# Patient Record
Sex: Male | Born: 1980 | Race: Black or African American | Hispanic: No | Marital: Single | Smoking: Never smoker
Health system: Southern US, Community
[De-identification: ages and names within clinical notes are randomized; demographics above are authoritative.]

## PROBLEM LIST (undated history)

## (undated) HISTORY — PX: ARTHROSCOPIC REPAIR ACL: SUR80

---

## 2004-02-26 ENCOUNTER — Emergency Department (HOSPITAL_COMMUNITY): Admission: EM | Admit: 2004-02-26 | Discharge: 2004-02-27 | Payer: Self-pay | Admitting: Emergency Medicine

## 2004-03-13 ENCOUNTER — Ambulatory Visit (HOSPITAL_COMMUNITY): Admission: RE | Admit: 2004-03-13 | Discharge: 2004-03-13 | Payer: Self-pay | Admitting: Orthopaedic Surgery

## 2004-04-01 ENCOUNTER — Ambulatory Visit (HOSPITAL_COMMUNITY): Admission: RE | Admit: 2004-04-01 | Discharge: 2004-04-02 | Payer: Self-pay | Admitting: Orthopaedic Surgery

## 2004-04-16 ENCOUNTER — Encounter: Admission: RE | Admit: 2004-04-16 | Discharge: 2004-05-19 | Payer: Self-pay | Admitting: Orthopaedic Surgery

## 2004-06-03 ENCOUNTER — Ambulatory Visit (HOSPITAL_COMMUNITY): Admission: RE | Admit: 2004-06-03 | Discharge: 2004-06-04 | Payer: Self-pay | Admitting: Orthopaedic Surgery

## 2004-06-06 ENCOUNTER — Encounter: Admission: RE | Admit: 2004-06-06 | Discharge: 2004-09-04 | Payer: Self-pay | Admitting: Orthopaedic Surgery

## 2005-05-05 ENCOUNTER — Emergency Department (HOSPITAL_COMMUNITY): Admission: EM | Admit: 2005-05-05 | Discharge: 2005-05-06 | Payer: Self-pay | Admitting: Emergency Medicine

## 2005-05-12 ENCOUNTER — Emergency Department (HOSPITAL_COMMUNITY): Admission: EM | Admit: 2005-05-12 | Discharge: 2005-05-13 | Payer: Self-pay | Admitting: Emergency Medicine

## 2006-03-23 ENCOUNTER — Emergency Department (HOSPITAL_COMMUNITY): Admission: EM | Admit: 2006-03-23 | Discharge: 2006-03-24 | Payer: Self-pay | Admitting: Emergency Medicine

## 2006-03-31 ENCOUNTER — Emergency Department (HOSPITAL_COMMUNITY): Admission: EM | Admit: 2006-03-31 | Discharge: 2006-03-31 | Payer: Self-pay | Admitting: Emergency Medicine

## 2006-06-18 ENCOUNTER — Emergency Department (HOSPITAL_COMMUNITY): Admission: EM | Admit: 2006-06-18 | Discharge: 2006-06-18 | Payer: Self-pay | Admitting: Emergency Medicine

## 2007-03-19 IMAGING — CR DG KNEE COMPLETE 4+V*L*
4 series · 4 of 4 positions shown · non-contrast
Comparison: none

CLINICAL DATA: Knee pain and swelling after a fall.
 LEFT KNEE ? 3 VIEW ? 06/18/06:

[t knee ap left]
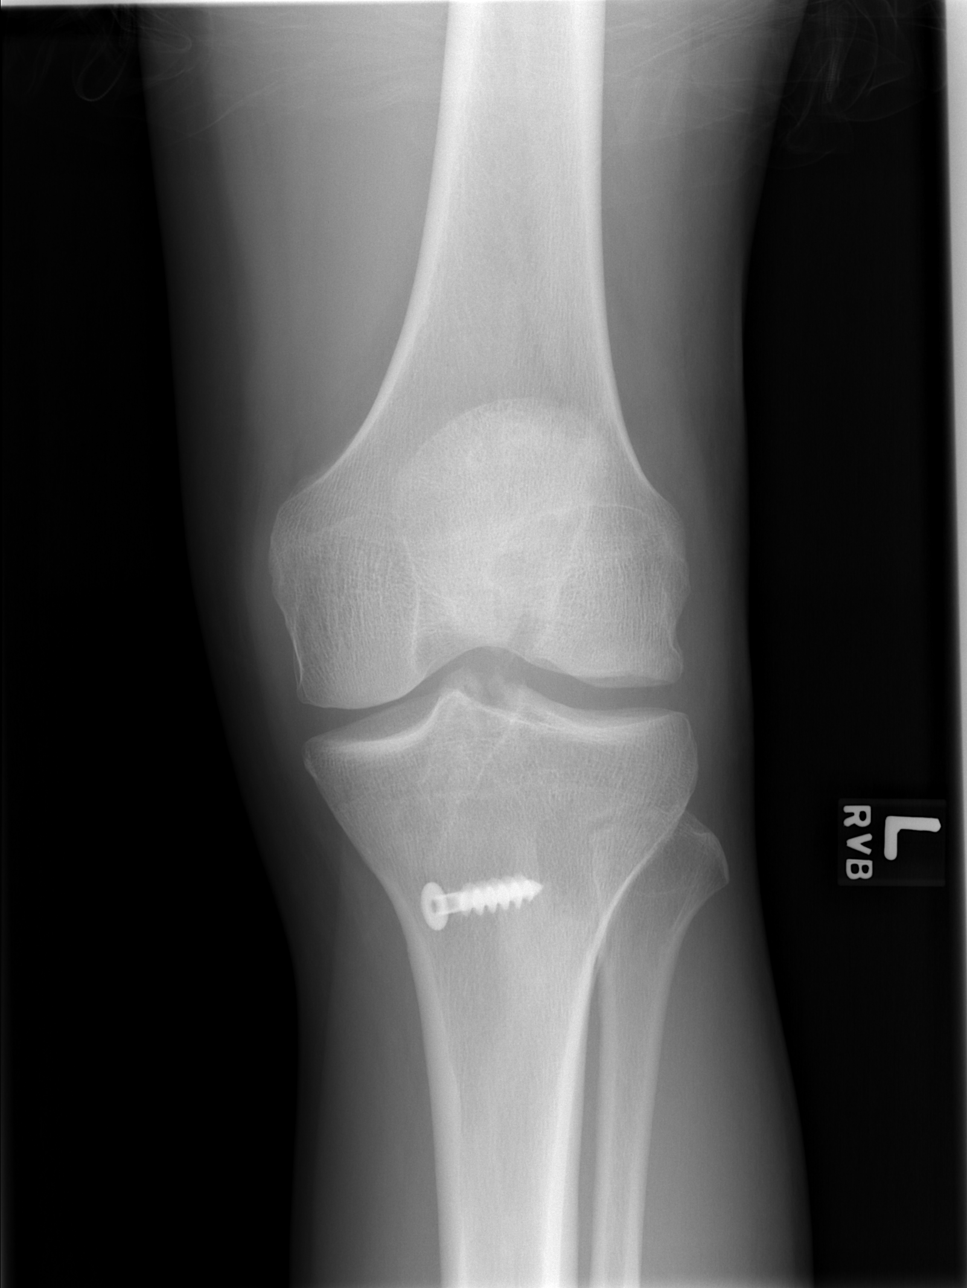

[t knee oblique left (1 of 2)]
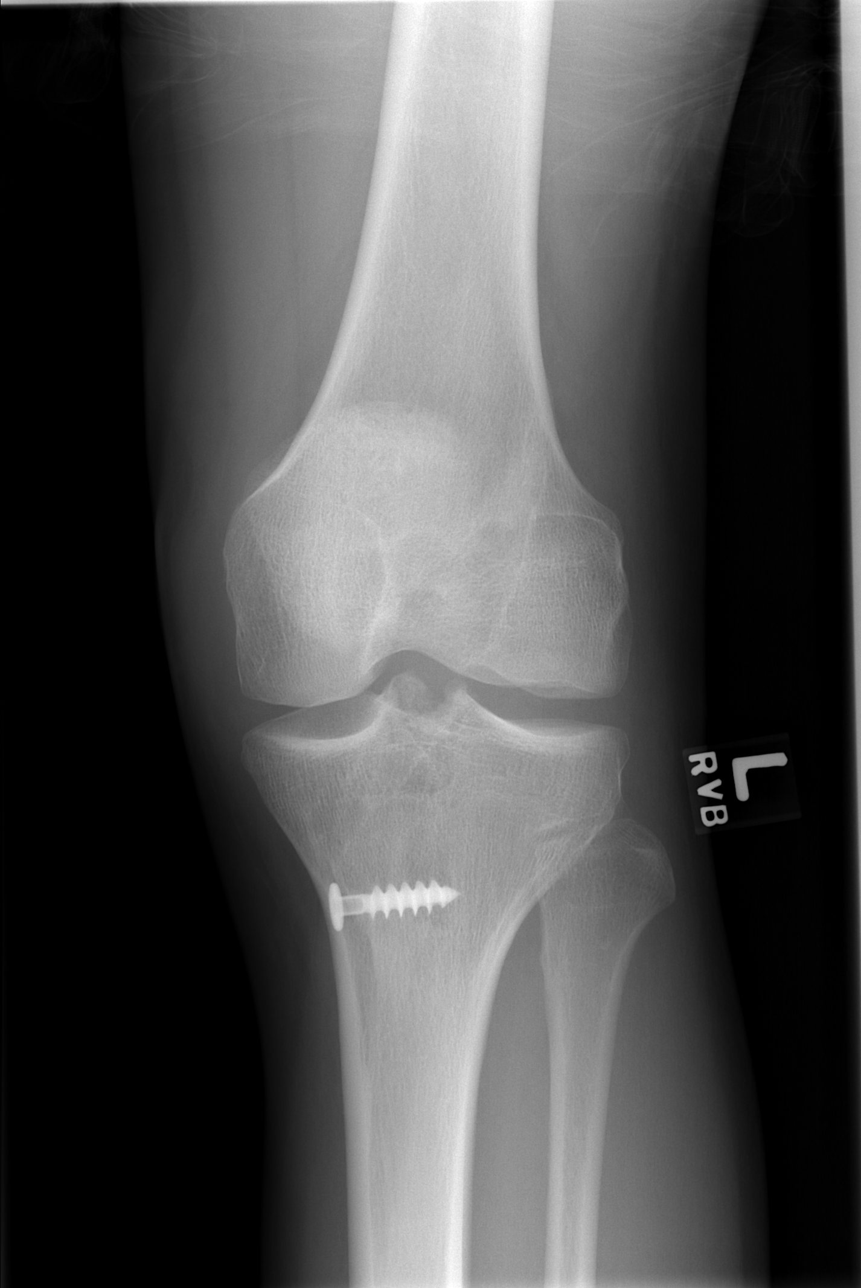

[t knee oblique left (2 of 2)]
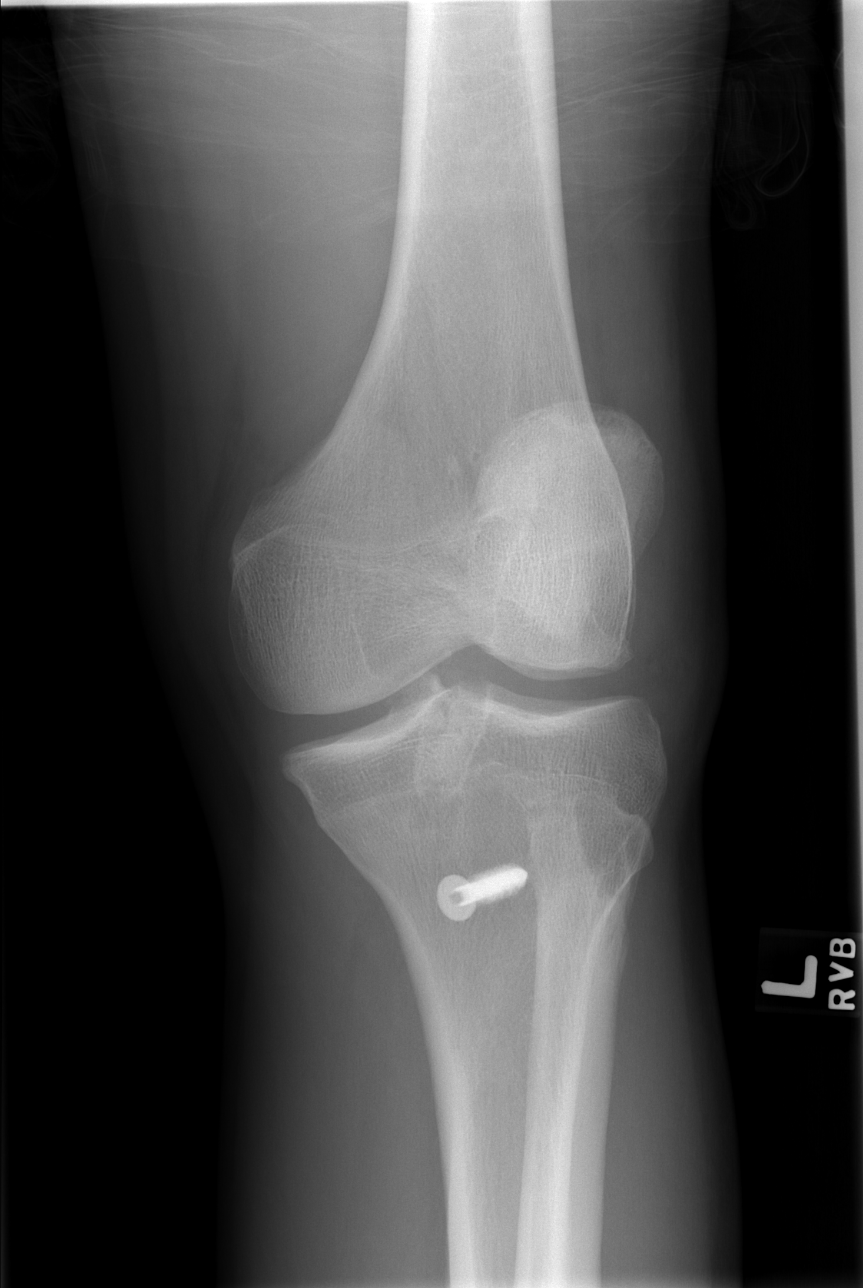

[t knee lat left]
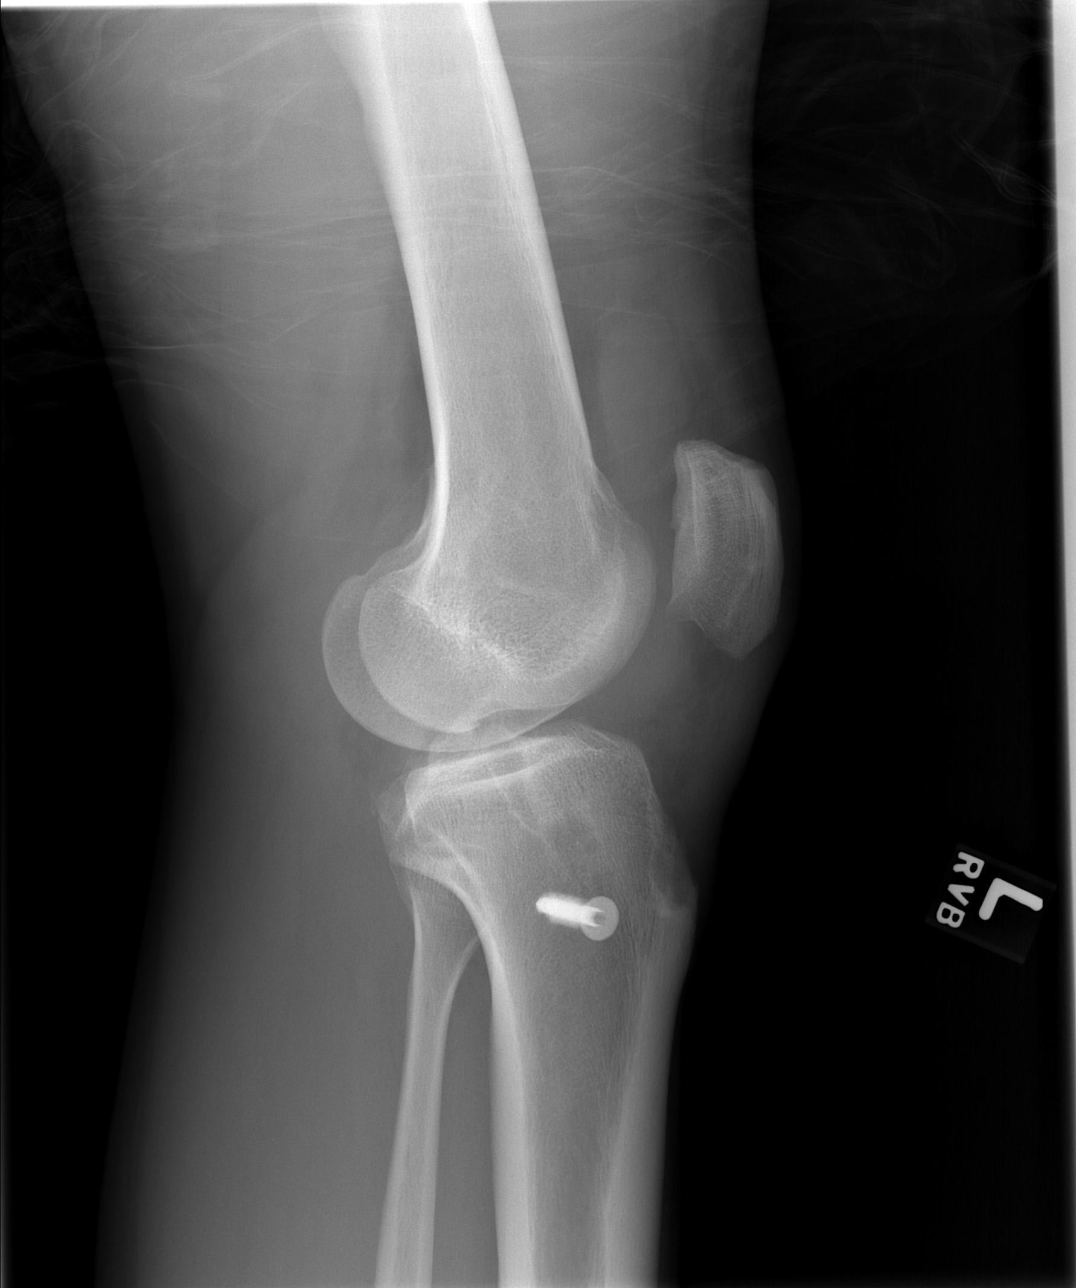

[4 of 4 positions shown; findings below may reference images not displayed]

FINDINGS: There are postoperative changes from ACL repair.  There is joint effusion identified.  The quadriceps and patellar tendons are intact.  There is moderate degenerative joint disease.  No fracture is noted.
IMPRESSION: 1.  Moderate size suprapatellar joint effusion.
 2.  Postoperative changes from ACL repair.
 3.  Degenerative joint disease.

## 2008-02-23 ENCOUNTER — Emergency Department (HOSPITAL_COMMUNITY): Admission: EM | Admit: 2008-02-23 | Discharge: 2008-02-23 | Payer: Self-pay | Admitting: Emergency Medicine

## 2010-12-26 NOTE — Op Note (Signed)
NAME:  Allen Leon, Allen Leon                         ACCOUNT NO.:  [id number]   MEDICAL RECORD NO.:  [id number]                   PATIENT TYPE:  OIB   LOCATION:  5013                                 FACILITY:  [facility]   PHYSICIAN:  Vanita Panda. Magnus Ivan, M.D.       DATE OF BIRTH:  07-Feb-1981   DATE OF PROCEDURE:  04/02/2004  DATE OF DISCHARGE:                                 OPERATIVE REPORT   PREOPERATIVE DIAGNOSIS:  Left knee anterior cruciate ligament rupture  (acute).   PROCEDURES:  1. Left knee anterior cruciate ligament reconstruction with bone-tendon-bone     autograft.  2. Partial left lateral meniscectomy.   SURGEON:  Vanita Panda. Magnus Ivan, M.D.   FIRST ASSISTANT:  Burnard Bunting, M.D.   ANESTHESIA:  General.   ESTIMATED BLOOD LOSS:  50 mL.   FLUIDS REPLACED:  2200 mL.   ANTIBIOTICS:  Ancef 1 g IV.   TOURNIQUET TIME:  2 hours 9 minutes.   INDICATIONS:  Briefly, Allen Leon is a 30 year old who was about six weeks ago  playing basketball and felt a pop in his knee.  He had symptoms of  instability and a large hemarthrosis.  He was seen in the orthopedic surgery  clinic and it was felt that he had an acute ACL rupture.  An MRI was  obtained of his left knee, and this did verify an acute rupture of the ACL.  It was recommended that he undergo ACL  reconstruction.  The risks and  benefits of this were explained to him, including infection, anesthesia,  nerve or vessel injury, and blood loss, and he agreed to proceed with  surgery.  He was also presented with options including bone-tendon-bone  autograft as well as Achilles tendon allograft.  He elected to undergo bone-  tendon-bone autograft.   PROCEDURE DESCRIPTION:  After being brought to the operating room, he was  placed supine on the operating table and general anesthesia was obtained.  A  nonsterile tourniquet was then placed around his left eye and then sterile  drapes were placed.  His knee was shaved and his leg  was prepped and draped  with Duraprep from the thigh down to the toes, and again sterile dressings  were applied.  A bump was placed under his left hip prior to draping.  First  attention was made in examining the knee.  Again, prior to prepping and  draping, the knee was examined and found to have a 1+ positive Lachman with  a negative pivot shift test.  This was definitely loose compared to the  contralateral side.  Next, after the knee was prepped and draped attention  was turned to harvesting the bone-tendon-bone autograft.  A longitudinal  incision was made with the knee flexed 90 degrees.  A longitudinal incision  was made from just proximal to the inferior pole of the patella down to the  level of the tibial tubercle.  This was carried sharply  through the skin and  subcutaneous tissue.  The peritenon was identified and divided  longitudinally to allow exposure of the patellar tendon.  Next a 10 mm  harvest blade was utilized to obtain the bone-tendon-bone autograft.  The  midsection of the patellar tendon was chosen and a longitudinal incision was  made with a 10 mm blade from a superior to an inferior direction.  Next a  Micro-Choice saw was used to obtain bone plugs from both the proximal tibia  and the inferior pole of the patella.  The graft was then harvested and  secured to the table.  Both bone plugs were shaped with the rongeur to allow  for passage through a 10 mm tunnel.  Drill holes were made in the superior  plug and inferior plug to allow for passage of the graft.  The wound was  then irrigated and the patellar tendon was reapproximated with interrupted 0  Vicryl sutures.  Next the arthroscopy portion of the case was started.  Through the original longitudinal incision an anterior lateral portal was  made just lateral to the patellar tendon.  The arthroscope was inserted and  the diagnostic portion of the arthroscopy was performed.  The intercondylar  area was assessed,  and there was noted an acute rupture of the ACL tendon  with an empty wall sign as well.  The medial and lateral compartments were  assessed.  The medial compartment was probed and found to have no evidence  of meniscal tear or cartilage injury.  The lateral compartment was probed,  and there did show some roughening of the surface of the lateral femoral  condyle with a grade 1 chondromalacia.  There was also noted a very small  radial tear in the lateral meniscus.  A side biter was used to cut this out  and then partial meniscal shaving was performed.  After this was performed,  the intercondylar area was performed.  The shaver was used to identify the  footprint of the ACL and then to clean out the lateral wall of the femur.  A  high-speed bur was likewise used to repair the intercondylar notch area.  This was all accomplished through an anteromedial portal through  arthroscopy.  Next an ACL guide was inserted into the medial portal to allow  for drilling of the tibial tunnel.  A guide pin was inserted approximately  1.5 cm distal to the tibial plateau and medial to the tibial tubercle.  The  guide pin was inserted and found to have an adequate position through the  footprint.  Next the tunnel was overdrilled with a 9 mm drill.  Next the  guide pin was inserted into the femoral portion and a footprint was made in  this area.  This was then likewise drilled to the level of 25 mm.  The guide  pin was inserted to allow for the passage of the #5 Ethibond suture.  This  was from the femoral bone plug of the graft.  The graft was then passed in a  retrograde manner from the tibial tunnel up into the intercondylar area and  then finally with the bone plug into the femoral tunnel.  The placement was  verified arthroscopically.  The femoral bone plug was then secured with an 8  x 23 mm Bio Interference screw from [employer].  After this was secured, a 6.5 x 30 mm cancellous screw post was placed in  the tibial plateau.  A #2  Fibrewire that was  placed in the tibial bone plug was inserted around the  post, and the post was tightened down.  The knee was put through a range of  motion with the graft in place and showed him to have no impingement with  full extension to 90 degrees of flexion.  The joint itself was then  copiously irrigated with several liters of normal saline solution.  The  remainder of the peritenon was closed with interrupted 0 suture.  The  arthroscopic portals were closed with interrupted 2-0 Vicryl suture,  followed by the subcutaneous tissue with 2-0 Vicryl suture in an interrupted  format.  Prolene 4-0 suture was then used as a continuous stitch through the  incision.  Steri-Strips were applied.  After this sterile dressings and knee  immobilizer were applied as well.  The tourniquet was let down with a total  tourniquet time of 2 hours 9 minutes.  The toes did pinken nicely.  He was  placed in a knee immobilizer, awakened, extubated, and taken to the recovery  room in stable condition.                                               Vanita Panda. Magnus Ivan, M.D.    CYB/MEDQ  D:  04/02/2004  T:  04/02/2004  Job:  [id number]

## 2010-12-26 NOTE — Op Note (Signed)
Allen Leon, Allen Leon               ACCOUNT NO.:  [id number]   MEDICAL RECORD NO.:  [id number]          PATIENT TYPE:  OIB   LOCATION:  2871                         FACILITY:  [facility]   PHYSICIAN:  Vanita Panda. 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   PROCEDURE:  1.  Left knee manipulation under anesthesia.  2.  Left knee arthroscopy with debridement and lysis of adhesions.   SURGEON:  Vanita Panda. Magnus Ivan, M.D.   ANESTHESIA:  General.   ESTIMATED BLOOD LOSS:  Minimal.   FLUIDS REPLACED:  1000 mL.   COMPLICATIONS:  None.   FINDINGS:  Preoperative range of motion 0 degrees to 30 degrees,  postoperative range of motion 0 degrees to 100 degrees.   INDICATIONS FOR PROCEDURE:  Briefly, Ronnald Nian is a 30 year old who,  approximately eight weeks ago, underwent a left ACL reconstruction using  bone-tendon-bone autograft.  Postoperatively, he had difficulty with therapy  secondary to pain in his knee as well as his lack of health insurance.  I  tried to work on getting his knee moving but after six weeks, his knee  motion had plateau'd.  He was able to get to therapy at that standpoint,  however, he certainly showed considerable lack of range of motion.  It was  recommended that he undergo manipulation under anesthesia with arthroscopy  to assess the ACL and he agreed to proceed with this.   DESCRIPTION OF PROCEDURE:  After being brought to the operating room, he was  placed supine on the operating table, and general anesthesia was obtained.  Manipulation of his knee was performed.  Before the manipulation, range of  motion estimated to be 0 to approximately 35  degrees.  Then, under gentle  manipulation, I was able to flex the knee down to at least 110 to 120  degrees.  I could feel adhesions giving way and there was even audible  giving way of adhesions during the manipulation.  His knee felt stable after  this and, on clinical exam, the ACL reconstruction felt stable, as well.  Next, the knee was prepped and draped with DuraPrep and sterile drapes for  the arthroscopy portion of the case.  The knee was flexed off the table and  anterior inferior lateral portal was made sharply with the knife and the  arthroscope was inserted.  A diagnostic tour of the knee was undertaken and  there was noted multiple scarring of the tissue.  The ACL was assessed and  pictures were obtained and showed an intact ACL.  An anterior inferior  medial portal was then made and the probe was inserted and showed integrity  of the ACL.  The knee was stressed, also, under arthroscopy and shown to  have a stable graft.  Next, a shaver was inserted and lysis of adhesions was  performed throughout  all three compartments of the knee.  Following the  arthroscopy, the scope was removed and injection of 0.25% Marcaine,  epinephrine, and 4 mg morphine was inserted into the knee, itself.  The  portals were closed with interrupted 4-0 nylon sutures and sterile dressings  were applied.  Postoperatively, he will be admitted to the hospital and set  up immediately in a CPM from 0 to 90 degrees to work on range of motion.  He  will be discharged with a CPM set up at home as well as physical therapy to  work on getting his knee moving.  The patient was awakened, extubated, and  taken to the recovery room in stable condition.       CYB/MEDQ  D:  06/03/2004  T:  06/03/2004  Job:  [id number]

## 2010-12-26 NOTE — Discharge Summary (Signed)
NAME:  Allen Leon                         ACCOUNT NO.:  [id number]   MEDICAL RECORD NO.:  [id number]                   PATIENT TYPE:  OIB   LOCATION:  5013                                 FACILITY:  [facility]   PHYSICIAN:  Vanita Panda. Magnus Ivan, M.D.       DATE OF BIRTH:  07-Sep-1980   DATE OF ADMISSION:  04/01/2004  DATE OF DISCHARGE:  04/03/2004                                 DISCHARGE SUMMARY   PREOPERATIVE DIAGNOSES:  Acute left anterior cruciate ligament (ACL)  rupture.   DISCHARGE DIAGNOSES:  Acute left anterior cruciate ligament (ACL) rupture.   PROCEDURE:  Left knee ACL reconstruction with bone-tendon-bone autograft on  April 01, 2004.   HISTORY OF PRESENT ILLNESS:  Allen Leon is a 30 year old gentleman who was  playing basketball and had an acute rupture of his left knee ACL. An MRI was  obtained that confirmed the diagnosis as well as clinical examination. It  was recommended that, given his age, he undergo ACL reconstruction. The  risks and benefits of this were explained to him and well documented in the  chart. He was given a choice of autograft versus allograft and the  information was given to him for this. He chose to proceed with autograft  reconstruction.   HOSPITAL COURSE:  He was brought to the operating room on the day of  admission and placed supine on the operating table. General anesthesia was  obtained. A tourniquet was then placed around his left thigh and a thigh  padding and padding at the foot were placed to allow his knee to be flexed  to 90 degrees. The left knee was then shaved and prepped and draped with  Dura-preps, from the thigh down to the toes. Upon general anesthesia,  examination of the knee was undertaken and he was found to have grossly  positive Lachman's test at about 1+ when compared to the contralateral knee.  He also had a negative pivot shift test. Attention was first turned to  harvesting the graft. Incision was made from just  proximal to the inferior  pole of patella down to the level of the tibial tubercle. This was made  sharp with a knife. This was carried through the superficial tissues. The  peritenon was then divided as well to allow for access with arthroscopic  portals. The harvest was made and then the tendon itself was repaired. The  remaining patellar tendon was re-approximated. The arthroscopic portion of  the case was then proceeded with and after removing the ACL stump and  repairing the intercondylar notch, the tibial tunnel was drilled followed by  the femoral tunnel and then the ACL graft was then passed. The knee was  found to be stable and the wounds were closed after being cleaned. For a  detailed description of the operation, please refer to the dictated  operative note in the patient's medical record. Postoperatively, the patient  was placed in a knee immobilizer. On postoperative day  1, he was found to be  with stable vital signs. The wound was assessed and the steri-strips were  found to be intact. New dressings were applied. He was placed back in his  knee immobilizer. Prior to discharge, physical therapy did come by to work  with ambulating the patient with weight bearing as tolerated and knee  immobilizer and just for crutch training and balance. His followup will be  in 2 weeks.   DISPOSITION:  To home.   DISCHARGE MEDICATIONS:  1. Oxycodone for severe pain.  2. Vicodin p.r.n.   DISCHARGE INSTRUCTIONS:  While he is at home, he should wear his knee  immobilizer at all times for the next 3 days. Then he can go back to his  knee brace and he will have it at 0 to 30 degrees for 2 days, followed by 0  to 60 degrees for 2 days, then followed by 0 to 90 degrees until his  followup. He demonstrated understanding of this. While he is in bed though,  he will have the leg locked at full extension.   FOLLOW UP:  Again, followup will be in 2 weeks. He is to call if there is  any other  questions or concerns otherwise.                                                Vanita Panda. Magnus Ivan, M.D.    CYB/MEDQ  D:  04/02/2004  T:  04/02/2004  Job:  [id number]

## 2013-02-20 ENCOUNTER — Emergency Department (HOSPITAL_COMMUNITY)
Admission: EM | Admit: 2013-02-20 | Discharge: 2013-02-20 | Disposition: A | Discharge status: Home / Self Care | Payer: Managed Care, Other (non HMO) | Source: Home / Self Care

## 2013-02-20 DIAGNOSIS — M545 Low back pain: Secondary | ICD-10-CM

## 2013-02-20 MED ORDER — MELOXICAM 15 MG PO TABS: 15.0000 mg | ORAL_TABLET | Freq: Every day | ORAL | Status: DC | PRN

## 2013-02-20 MED ORDER — CYCLOBENZAPRINE HCL 10 MG PO TABS: 10.0000 mg | ORAL_TABLET | Freq: Two times a day (BID) | ORAL | Status: DC | PRN

## 2013-02-20 MED ORDER — TRAMADOL HCL 50 MG PO TABS: 50.0000 mg | ORAL_TABLET | Freq: Four times a day (QID) | ORAL | Status: DC | PRN

## 2013-02-20 NOTE — ED Provider Notes (Signed)
Allen Leon is a 32 y.o. male who presents to Urgent Care today for low back pain starting Friday without injury. Patient notes pain especially in his right low back. The pain does not radiate. The pain is not associated with weakness numbness bowel bladder dysfunction or difficulty walking. He's tried some over-the-counter NSAIDs which have been somewhat helpful. He has difficulty working because of pain. No fevers or chills and feels well otherwise.   PMH reviewed. Healthy otherwise History  Substance Use Topics  . Smoking status: Not on file  . Smokeless tobacco: Not on file  . Alcohol Use: Not on file   ROS as above Medications reviewed. No current facility-administered medications for this encounter.   Current Outpatient Prescriptions  Medication Sig Dispense Refill  . cyclobenzaprine (FLEXERIL) 10 MG tablet Take 1 tablet (10 mg total) by mouth 2 (two) times daily as needed for muscle spasms.  20 tablet  0  . meloxicam (MOBIC) 15 MG tablet Take 1 tablet (15 mg total) by mouth daily as needed for pain.  30 tablet  0  . traMADol (ULTRAM) 50 MG tablet Take 1 tablet (50 mg total) by mouth every 6 (six) hours as needed for pain.  30 tablet  0    Exam:  BP 126/84  Pulse 83  Temp(Src) 98.7 F (37.1 C)  Resp 12  SpO2 100% Gen: Well NAD Back: Nontender spinal midline. Tender to palpation right SI joint. Normal back range of motion. Strength is intact lower extremities. Patient is able to stand from a seated position stand on his toes and heels and has a normal gait. Sensation and capillary refill are intact distally bilateral lower extremity.   No results found for this or any previous visit (from the past 24 hour(s)). No results found.  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  Symptomatic management with NSAIDs, Flexeril, tramadol Home exercise program.  Heating pad.  Followup with orthopedics in 2 weeks if not improving.  Come back or go to the  emergency room if you notice new weakness new numbness problems walking or bowel or bladder problems.      Rodolph Bong, MD 02/20/13 1515

## 2013-02-20 NOTE — ED Notes (Signed)
States back pain since Friday.  States he was playing basketball and someone stepped on his foot but back wasn't hurting til he got home.

## 2013-10-17 ENCOUNTER — Ambulatory Visit (INDEPENDENT_AMBULATORY_CARE_PROVIDER_SITE_OTHER): Payer: Managed Care, Other (non HMO) | Admitting: Family Medicine

## 2013-10-17 VITALS — BP 138/102 | HR 89 | Temp 98.7°F | Resp 18 | Ht 66.0 in | Wt 184.0 lb

## 2013-10-17 DIAGNOSIS — Z7184 Encounter for health counseling related to travel: Secondary | ICD-10-CM

## 2013-10-17 DIAGNOSIS — Z7189 Other specified counseling: Secondary | ICD-10-CM

## 2013-10-17 DIAGNOSIS — Z7185 Encounter for immunization safety counseling: Secondary | ICD-10-CM

## 2013-10-17 DIAGNOSIS — Z23 Encounter for immunization: Secondary | ICD-10-CM

## 2013-10-17 MED ORDER — HEPATITIS A VACCINE 1440 EL U/ML IM SUSP: 1.0000 mL | Freq: Once | INTRAMUSCULAR | Status: AC

## 2013-10-17 NOTE — Patient Instructions (Signed)
Traveling Outside the [place]  · See your doctor at least 4 - 6 weeks before your trip. This allows time for immunizations to take effect. If it is less than 4 weeks before you leave, you should still see your caregiver. You might still benefit from shots or medicines and information about how to protect yourself while traveling.  · Your caregiver will ask you where you intend to travel, how long you intend to stay, and whether you may visit rural areas. This determines what vaccinations should be considered. Know your travel schedule when you visit your caregiver.  · Adolescents and children should seek guidance on their vaccination status from their caregiver. So should women who are breastfeeding or pregnant, and people with altered immunity (HIV/AIDS, diabetes).  CDC RECOMMENDS THE FOLLOWING VACCINES (AS NEEDED BY AGE AND BY WORLD REGION):  · Routine Vaccines: Be up to date on your routine vaccinations. Get boosters, if needed. These include diphtheria, tetanus, and pertussis (DPT), measles, mumps, and rubella (MMR), influenza (flu), and varicella (chickenpox). Also, meningococcal, if you are 11 to 33 years of age, and zoster (shingles) if over age 60. Possibly pneumococcal, if you are a smoker or have long-term (chronic) lung or heart disease.  · Typhoid: If you are visiting low income or developing countries.  · Yellow Fever: If traveling to an area where the disease is prevalent (endemic).  · HPV: (Human Papilloma Virus), if you are 26 years old or younger and intend to be sexually active.  · Rabies: If you might be exposed to wild or domestic animals. Pre-exposure rabies vaccine is urged for people doing more than short-term travel in countries where rabies is common (including [place]).  · Polio: A single one-time booster is advised for travel to [place] and [place].  · Hepatitis A: This is routinely given to children beginning at age 2 years. It is often advised for most foreign travel, including  [place].  · Hepatitis B: This is given routinely to infants, children, and adolescents.  · Meningococcal: This is advised for travel to developing countries, where risk is high. For example, parts of sub-Saharan [place] ("meningitis belt"). [place] requires vaccine for all pilgrims attending the Hajj (religious travel to [place]).  · Malaria: A vaccine does not yet exist. Oral medicines can prevent the usual types of malaria and drug-resistant strains. The most common medicine prescribed is LARIAM (mefloquine). It is taken once weekly before, during, and after travel. Other drugs are also used for malaria prevention, including chloroquine and doxycycline.  · Japanese B Encephalitis (JE): This is a moderately toxic vaccine. Use is generally limited to travelers to [place], who will have long rural exposure to mosquitoes, in areas with high likelihood of disease spreading (such as, rice paddies).  TO STAY HEALTHY, DO:  · Wash hands often, with soap and water.  · Drink only bottled or boiled water, or carbonated (bubbly) drinks in cans or bottles. Avoid tap water, fountain drinks, and ice cubes. If not possible, make water safer by BOTH filtering through an "absolute 1-micron or less" filter AND adding iodine tablets. Such filters are found in camping and outdoor supply stores.  · When buying carbonated drinks or bottled water, always inspect the bottle seal. Make sure it has not been previously opened. This could mean it was refilled with unclean beverages or water. If you suspect a bottle seal has been tampered with, return or discard it.  · Eat only thoroughly cooked food from a reputable restaurant or   food service provider, who routinely caters to foreign travelers. Only eat meat, fish, or shellfish that have been thoroughly cooked. Otherwise, they can infect you and cause gastroenteritis.  · Avoid foods that have been prepared and left standing at room temperature. These often support bacterial growth that can make  you ill.  · If you will be visiting an area where there is risk for malaria, take your malaria prevention medicine before, during, and after travel, as directed. (See your doctor for a prescription.)  · Protect yourself from mosquito bites:  · Pay special attention to mosquito protection between dusk and dawn. This is when malaria-carrying mosquitos are active.  · Wear long-sleeved shirts, long pants, and hats.  · Use insect repellants that contain DEET (diethylmethyltoluamide).  · Read and follow the directions and precautions on the product label.  · Apply insect repellent to all exposed skin.  · Do not put repellent on wounds or broken skin.  · Do not breathe in, swallow, or get DEET in your eyes. DEET is toxic if swallowed. If using a spray product, apply DEET to your face by spraying your hands and rubbing the product carefully over the face. Avoid your eyes and mouth.  · Purchase a bed net impregnated (treated) with the insecticide permethrin or deltamethrin. Or, spray the bed net with one of these insecticides. This is not needed if you are staying in air-conditioned or well-screened housing.  · DEET may be used on adults, children, and infants older than 2 months of age. Protect infants by using a carrier draped with mosquito netting, with an elastic edge for a tight fit.  · Children under 10 years old should not apply insect repellent themselves. Do not apply to young children's hands or around their eyes and mouth.  · If you are visiting areas where malaria occurs, read the malaria prevention recommendations on the CDC malaria website (www.cdc.gov/MALARIA). Your caregiver will guide you on the selection and use of an anti-malaria preventive medicine that you may need to take before, during, and after your visit.  · To prevent fungal and parasitic infections, keep feet clean and dry. Do not go barefoot.  · Always use condoms to reduce the risk of HIV and other sexually transmitted diseases.  · Try to travel  in vehicles that have seat belts, whenever possible. If renting a vehicle, try to rent a larger one for added protection. Wear helmets whenever bicycling or motorcycling. Avoid alcohol when operating any vehicle, even a bicycle. Avoid overcrowded, over-weighted, or top heavy buses or mini-vans. Be aware that pedestrian patterns vary greatly by country.  TO AVOID GETTING SICK:  · Do not eat food purchased from street vendors.  · Eat at restaurants that often cater to foreign travelers (leading hotels, hotel chains).  · Do not drink beverages with ice.  · Do not eat dairy products, unless you know they have been pasteurized.  · Do not share needles with anyone.  · Do not handle animals (especially monkeys, dogs, and cats). Avoid bites and serious diseases (including rabies and plague).  · Do not swim in fresh water. Salt water is often safer. Avoid swimming pools that are not chlorinated.  · Do not have unprotected sex.  WHAT YOU NEED TO BRING WITH YOU:  · Long-sleeved shirt, long pants, and a hat to wear outside. This is to prevent illnesses carried by insects (malaria, dengue, filariasis, leishmaniasis, onchocerciasis).  · Contact information card, for use in urgent situations. This should list   the names, addresses, and telephone numbers of family member(s) or contact(s) in your country, your primary caregivers, important specialty home caregivers, area hospitals and clinics where you will travel, and your national consulate or embassy.  · Purchase a pre-packaged travel health kit from a reputable source, or create one yourself. This should include a first aid kit and commonly needed medicines.  ITEMS TO INCLUDE IN A TRAVEL HEALTH KIT:  · Insect repellent containing DEET.  · Bed nets impregnated with permethrin. (Can be purchased in camping or military supply stores. Overseas, permethrin or another insecticide, deltamethrin, may be purchased to treat bed nets and clothes.)  · Flying-insect spray or mosquito coils,  to help clear rooms of mosquitoes. The product should contain a pyrethroid insecticide. These insecticides quickly kill flying insects, including mosquitoes.  · Over-the-counter anti-diarrhea medicine, to take if you have diarrhea.  · Iodine tablets and water filters to purify water, if bottled water is not available.  · Sunblock and sunglasses.  · Antibacterial hand wipes or an alcohol-based hand sanitizer. Must contain at least 60% alcohol.  · Extra pair of contacts or prescription glasses, or both, for people who wear corrective lenses.  · Prescription medicines. Make sure you have enough to last during your trip, as well as a copy of the prescription(s).  · Destination-related medicines, if applicable:  · Anti-malaria medicines.  · Medicine to prevent or treat high-altitude illness.  · Pain or fever medicines (acetaminophen, aspirin, ibuprofen).  · Stomach upset or diarrhea medicines:  · Over-the-counter anti-diarrhea medicine (loperamide, bismuth subsalicylate).  · Antibiotic for self-treatment of moderate to severe diarrhea.  · Oral rehydration solution packets.  · Mild laxative.  · Antacid.  · Items to treat throat and respiratory symptoms:  · Antihistamine.  · Decongestant, alone or combined with antihistamine.  · Cough suppressant or expectorant (promotes the expulsion of mucus).  · Throat lozenges.  · Anti-motion sickness medicine.  · Epinephrine auto-injector (such as an EpiPen), if you have a history of severe allergic reaction. Smaller dose packages are available for children.  · Any medicines, prescription or over-the-counter, taken on a regular basis at home.  For Basic First Aid  · Disposable gloves (at least two pairs).  · Adhesive bandages, multiple sizes.  · Gauze.  · Adhesive tape.  · Elastic bandage wrap for sprains and strains.  · Antiseptic.  · Cotton swabs.  · Tweezers.  · Scissors.  · Antifungal and antibacterial ointments or creams.  · 1% hydrocortisone cream.  · Anti-itch gel or cream, for  insect bites and stings.  · Aloe gel for sunburns.  · Moleskin or molefoam for blisters.  · Digital thermometer.  · Saline eye drops.  · First-aid quick reference card.  · Commercial suture and syringe kits, to be used by a local caregiver. (These items will also require a letter from the prescribing physician, on official letterhead stationery.)  Note that some of the above items are considered sharp. They will need to be packed in your checked luggage, not in a carry on.   AFTER YOU RETURN HOME:  If you have visited a malaria risk area, continue taking your anti-malaria drug for 4 weeks (chloroquine, doxycycline, or mefloquine) or 7 days (atovaquone/proguanil) after leaving the risk area. Malaria is always a serious disease and may be a deadly illness. If you become ill with a fever or flu-like illness, while traveling or after you return home (for up to 1 year), you should seek immediate medical attention.

## 2013-10-17 NOTE — Progress Notes (Signed)
° °  Subjective:  This chart was scribed for Elvina SidleKurt Lauenstein, MD  by Leone PayorSonum Patel, ED Scribe. This patient was seen in room 13 and the patient's care was started 1:47 PM.    Patient ID: Allen Leon, male    DOB: 07/14/1981, 33 y.o.   MRN: [id number]  HPI HPI Comments: Allen Leon is a 33 y.o. male who [facility] requesting immunizations for upcoming travel. Pt states he is travelling to [place], [place] in 2 days. Pt states he will be staying at a resort for 4-5 days. He denies any complaints at this time.    Review of Systems  Constitutional:       Requesting immunizations       Objective:   Physical Exam  Nursing note and vitals reviewed. Constitutional: He is oriented to person, place, and time. He appears well-developed and well-nourished.  HENT:  Head: Normocephalic and atraumatic.  Cardiovascular: Normal rate.   Pulmonary/Chest: Effort normal.  Abdominal: He exhibits no distension.  Neurological: He is alert and oriented to person, place, and time.  Skin: Skin is warm and dry.  Psychiatric: He has a normal mood and affect.        Assessment & Plan:    Patient is just traveling to [place] as I feel that just hepatitis A as necessary. He's not going to a high malaria risk area and I don't believe the typhoid as indicated. Travel advice encounter - Plan: hepatitis A virus (PF) vaccine (HAVRIX (PF)) 1440 EL U/ML injection 1,440 Units  Immunization counseling - Plan: hepatitis A virus (PF) vaccine (HAVRIX (PF)) 1440 EL U/ML injection 1,440 Units  Signed, Elvina SidleKurt Lauenstein, MD

## 2017-05-05 ENCOUNTER — Encounter (HOSPITAL_COMMUNITY): Payer: Self-pay | Admitting: Emergency Medicine

## 2017-05-05 ENCOUNTER — Ambulatory Visit (HOSPITAL_COMMUNITY)
Admission: EM | Admit: 2017-05-05 | Discharge: 2017-05-05 | Disposition: A | Discharge status: Home / Self Care | Payer: Managed Care, Other (non HMO) | Attending: Family Medicine | Admitting: Family Medicine

## 2017-05-05 DIAGNOSIS — J020 Streptococcal pharyngitis: Secondary | ICD-10-CM | POA: Diagnosis not present

## 2017-05-05 DIAGNOSIS — R509 Fever, unspecified: Secondary | ICD-10-CM

## 2017-05-05 LAB — POCT RAPID STREP A: STREPTOCOCCUS, GROUP A SCREEN (DIRECT): POSITIVE — AB

## 2017-05-05 MED ORDER — AMOXICILLIN 875 MG PO TABS: 875.0000 mg | ORAL_TABLET | Freq: Two times a day (BID) | ORAL | 0 refills | Status: AC

## 2017-05-05 NOTE — ED Triage Notes (Signed)
PT reports sore throat for one week. Tonsils are enlarged with white patches.

## 2017-05-05 NOTE — ED Provider Notes (Signed)
  [facility]   [id number] 05/05/17 Arrival Time: 1213  ASSESSMENT & PLAN:  1. Strep throat     Meds ordered this encounter  Medications  . amoxicillin (AMOXIL) 875 MG tablet    Sig: Take 1 tablet (875 mg total) by mouth 2 (two) times daily.    Dispense:  20 tablet    Refill:  0    Rapid Strep: Positive  OTC analgesics and throat care as needed. Instructed to finish full 10 day course of antibiotics. Will follow up if not showing significant improvement over the next 24-48 hours.  Reviewed expectations re: course of current medical issues. Questions answered. Outlined signs and symptoms indicating need for more acute intervention. Patient verbalized understanding. After Visit Summary given.   SUBJECTIVE:  Allen Leon is a 36 y.o. male who reports a sore throat. Onset gradual beginning 1 week ago. Subjective fever and chills. No respiratory symptoms. Tolerating normal PO intake but pain with swallowing. Ibuprofen with temporary help. No n/v. No rashes.  ROS: As per HPI.   OBJECTIVE:  Vitals:   05/05/17 1333 05/05/17 1334  BP: (!) 155/97   Pulse: (!) 112   Resp: 16   Temp: 99.6 F (37.6 C)   TempSrc: Oral   SpO2: 99%   Weight:  166 lb (75.3 kg)     General appearance: alert; no distress HEENT: tonsillar hypertrophy, moderate erythema and exudates present Neck: supple with FROM; cervical LAD, tender Lungs: clear to auscultation bilaterally Skin: reveals no rash; warm and dry Psychological: alert and cooperative; normal mood and affect  Results for orders placed or performed during the hospital encounter of 05/05/17  POCT rapid strep A [facility])  Result Value Ref Range   Streptococcus, Group A Screen (Direct) POSITIVE (A) NEGATIVE    Labs Reviewed  POCT RAPID STREP A - Abnormal; Notable for the following:       Result Value   Streptococcus, Group A Screen (Direct) POSITIVE (*)    All other components within normal limits    No  results found.  No Known Allergies   Social History   Social History  . Marital status: Single    Spouse name: N/A  . Number of children: N/A  . Years of education: N/A   Occupational History  . Not on file.   Social History Main Topics  . Smoking status: Never Smoker  . Smokeless tobacco: Never Used  . Alcohol use Yes  . Drug use: No  . Sexual activity: Not on file   Other Topics Concern  . Not on file   Social History Narrative  . No narrative on file   Family History  Problem Relation Age of Onset  . Stroke Mother   . Cancer Father           Mardella Layman, MD 05/05/17 [phone]

## 2018-10-23 ENCOUNTER — Encounter (HOSPITAL_COMMUNITY): Payer: Self-pay | Admitting: Emergency Medicine

## 2018-10-23 ENCOUNTER — Other Ambulatory Visit: Payer: Self-pay

## 2018-10-23 ENCOUNTER — Ambulatory Visit (HOSPITAL_COMMUNITY)
Admission: EM | Admit: 2018-10-23 | Discharge: 2018-10-23 | Disposition: A | Discharge status: Home / Self Care | Payer: Managed Care, Other (non HMO) | Attending: Family Medicine | Admitting: Family Medicine

## 2018-10-23 DIAGNOSIS — J039 Acute tonsillitis, unspecified: Secondary | ICD-10-CM | POA: Insufficient documentation

## 2018-10-23 LAB — POCT RAPID STREP A: Streptococcus, Group A Screen (Direct): NEGATIVE

## 2018-10-23 MED ORDER — AMOXICILLIN 500 MG PO CAPS: 500.0000 mg | ORAL_CAPSULE | Freq: Two times a day (BID) | ORAL | 0 refills | Status: AC

## 2018-10-23 NOTE — Discharge Instructions (Signed)
Rapid strep negative. However, given your exam, will cover you empirically for bacterial infection with amoxicillin. As discussed, symptoms can still be due to viral illness/ drainage down your throat. This usually takes 7-10 days to resolve. You can take over the counter allergy medicine such as Flonase, Zyrtec-D for nasal congestion/drainage. You can use over the counter nasal saline rinse such as neti pot for nasal congestion. Monitor for any worsening of symptoms, swelling of the throat, trouble breathing, trouble swallowing, leaning forward to breath, drooling, go to the emergency department for further evaluation needed.  For sore throat/cough try using a honey-based tea. Use 3 teaspoons of honey with juice squeezed from half lemon. Place shaved pieces of ginger into 1/2-1 cup of water and warm over stove top. Then mix the ingredients and repeat every 4 hours as needed.

## 2018-10-23 NOTE — ED Triage Notes (Signed)
Pt presents to [facility] for assessment of sore throat since Thursday.  Pain rated 3/10.  Denies body aches, denies fevers.

## 2018-10-23 NOTE — ED Provider Notes (Signed)
[facility]    CSN: [id number] Arrival date & time: 10/23/18  1152     History   Chief Complaint Chief Complaint  Patient presents with  . Sore Throat    HPI Allen Leon is a 38 y.o. male.   38 year old male comes in for few day history of sore throat.  Denies rhinorrhea, nasal congestion, cough.  Denies fever, chills, night sweats.  Denies body aches.  He has painful swallowing without trouble breathing, swelling of the throat, tripoding, drooling, trismus.  Has not taken anything for the symptoms.  No obvious sick contact.     History reviewed. No pertinent past medical history.  There are no active problems to display for this patient.   Past Surgical History:  Procedure Laterality Date  . ARTHROSCOPIC REPAIR ACL         Home Medications    Prior to Admission medications   Medication Sig Start Date End Date Taking? Authorizing Provider  amoxicillin (AMOXIL) 500 MG capsule Take 1 capsule (500 mg total) by mouth 2 (two) times daily. 10/23/18   Belinda Fisher, PA-C    Family History Family History  Problem Relation Age of Onset  . Stroke Mother   . Cancer Father     Social History Social History   Tobacco Use  . Smoking status: Never Smoker  . Smokeless tobacco: Never Used  Substance Use Topics  . Alcohol use: Yes  . Drug use: No     Allergies   Patient has no known allergies.   Review of Systems Review of Systems  Reason unable to perform ROS: See HPI as above.     Physical Exam Triage Vital Signs ED Triage Vitals  Enc Vitals Group     BP 10/23/18 1227 (!) 141/93     Pulse Rate 10/23/18 1227 74     Resp 10/23/18 1227 16     Temp 10/23/18 1227 98.3 F (36.8 C)     Temp Source 10/23/18 1227 Temporal     SpO2 10/23/18 1227 100 %     Weight --      Height --      Head Circumference --      Peak Flow --      Pain Score 10/23/18 1228 3     Pain Loc --      Pain Edu? --      Excl. in GC? --    No data found.  Updated  Vital Signs BP (!) 141/93 (BP Location: Right Arm)   Pulse 74   Temp 98.3 F (36.8 C) (Temporal)   Resp 16   SpO2 100%   Physical Exam Constitutional:      General: He is not in acute distress.    Appearance: He is well-developed. He is not ill-appearing, toxic-appearing or diaphoretic.  HENT:     Head: Normocephalic and atraumatic.     Right Ear: Tympanic membrane, ear canal and external ear normal. Tympanic membrane is not erythematous or bulging.     Left Ear: Tympanic membrane, ear canal and external ear normal. Tympanic membrane is not erythematous or bulging.     Nose: Nose normal.     Right Sinus: No maxillary sinus tenderness or frontal sinus tenderness.     Left Sinus: No maxillary sinus tenderness or frontal sinus tenderness.     Mouth/Throat:     Mouth: Mucous membranes are moist.     Pharynx: Oropharynx is clear. Uvula midline.  Tonsils: Tonsillar exudate present. Swelling: 2+ on the right. 2+ on the left.  Eyes:     Conjunctiva/sclera: Conjunctivae normal.     Pupils: Pupils are equal, round, and reactive to light.  Neck:     Musculoskeletal: Normal range of motion and neck supple.  Cardiovascular:     Rate and Rhythm: Normal rate and regular rhythm.     Heart sounds: Normal heart sounds. No murmur. No friction rub. No gallop.   Pulmonary:     Effort: Pulmonary effort is normal. No accessory muscle usage, prolonged expiration, respiratory distress or retractions.     Breath sounds: Normal breath sounds. No stridor, decreased air movement or transmitted upper airway sounds. No decreased breath sounds, wheezing, rhonchi or rales.  Skin:    General: Skin is warm and dry.  Neurological:     Mental Status: He is alert and oriented to person, place, and time.      UC Treatments / Results  Labs (all labs ordered are listed, but only abnormal results are displayed) Labs Reviewed  CULTURE, GROUP A STREP [facility])  POCT RAPID STREP A    EKG None  Radiology No  results found.  Procedures Procedures (including critical care time)  Medications Ordered in UC Medications - No data to display  Initial Impression / Assessment and Plan / UC Course  I have reviewed the triage vital signs and the nursing notes.  Pertinent labs & imaging results that were available during my care of the patient were reviewed by me and considered in my medical decision making (see chart for details).    Rapid strep negative.  However, given history and exam, will cover for tonsillitis with amoxicillin.  Other symptomatic treatment discussed.  Return precautions given.  Patient expresses understanding and agrees to plan.  Final Clinical Impressions(s) / UC Diagnoses   Final diagnoses:  Tonsillitis    ED Prescriptions    Medication Sig Dispense Auth. Provider   amoxicillin (AMOXIL) 500 MG capsule Take 1 capsule (500 mg total) by mouth 2 (two) times daily. 20 capsule Threasa Alpha, [place] 10/23/18 1438

## 2018-10-25 LAB — CULTURE, GROUP A STREP (THRC)

## 2018-10-26 ENCOUNTER — Emergency Department (HOSPITAL_COMMUNITY)
Admission: EM | Admit: 2018-10-26 | Discharge: 2018-10-26 | Disposition: A | Discharge status: Home / Self Care | Payer: Managed Care, Other (non HMO) | Attending: Emergency Medicine | Admitting: Emergency Medicine

## 2018-10-26 ENCOUNTER — Other Ambulatory Visit: Payer: Self-pay

## 2018-10-26 ENCOUNTER — Encounter (HOSPITAL_COMMUNITY): Payer: Self-pay

## 2018-10-26 DIAGNOSIS — S61452A Open bite of left hand, initial encounter: Secondary | ICD-10-CM | POA: Insufficient documentation

## 2018-10-26 DIAGNOSIS — Z23 Encounter for immunization: Secondary | ICD-10-CM | POA: Insufficient documentation

## 2018-10-26 DIAGNOSIS — Y999 Unspecified external cause status: Secondary | ICD-10-CM | POA: Diagnosis not present

## 2018-10-26 DIAGNOSIS — Y9389 Activity, other specified: Secondary | ICD-10-CM | POA: Diagnosis not present

## 2018-10-26 DIAGNOSIS — Y929 Unspecified place or not applicable: Secondary | ICD-10-CM | POA: Insufficient documentation

## 2018-10-26 DIAGNOSIS — S61451A Open bite of right hand, initial encounter: Secondary | ICD-10-CM | POA: Diagnosis not present

## 2018-10-26 DIAGNOSIS — W540XXA Bitten by dog, initial encounter: Secondary | ICD-10-CM | POA: Insufficient documentation

## 2018-10-26 MED ORDER — AMOXICILLIN-POT CLAVULANATE 875-125 MG PO TABS: 1.0000 | ORAL_TABLET | Freq: Two times a day (BID) | ORAL | 0 refills | Status: AC

## 2018-10-26 MED ORDER — TETANUS-DIPHTH-ACELL PERTUSSIS 5-2.5-18.5 LF-MCG/0.5 IM SUSP
0.5000 mL | Freq: Once | INTRAMUSCULAR | Status: AC
  Administered 2018-10-26: 0.5 mL via INTRAMUSCULAR
  Filled 2018-10-26: qty 0.5

## 2018-10-26 MED ORDER — AMOXICILLIN-POT CLAVULANATE 875-125 MG PO TABS
1.0000 | ORAL_TABLET | Freq: Once | ORAL | Status: AC
  Administered 2018-10-26: 1 via ORAL
  Filled 2018-10-26: qty 1

## 2018-10-26 NOTE — ED Notes (Signed)
Pt medicated and wounds cleaned and dressed. No acute distress

## 2018-10-26 NOTE — ED Triage Notes (Signed)
Pt reports that he was bit by his dog on his hands earlier this evening. It is his dog, but shots are not updated. A&Ox4. Ambulatory,

## 2018-10-26 NOTE — ED Notes (Signed)
Pt received and verbalized understanding of d/c instructions and need for follow up with pcp. Told to return if s/s worsen. Told to call vet in AM for protocol. No acute distress at time of discharge, pt walked himself out.

## 2018-10-26 NOTE — ED Provider Notes (Addendum)
[facility]-EMERGENCY DEPT Provider Note   CSN: [id number] Arrival date & time: 10/26/18  0142    History   Chief Complaint Chief Complaint  Patient presents with  . Animal Bite    HPI Allen Leon is a 38 y.o. male.     The history is provided by the patient. No language interpreter was used.  Animal Bite  Contact animal:  Dog Location:  Hand Hand injury location:  Dorsum of L hand and dorsum of R hand Pain details:    Quality:  Aching   Severity:  Mild   Timing:  Constant   Progression:  Unchanged Incident location:  Home Provoked: unprovoked   Notifications:  None Animal's rabies vaccination status:  Out of date Animal in possession: yes   Tetanus status:  Unknown Relieved by:  Nothing Worsened by:  Nothing Associated symptoms: no fever   Scratches on the hands.    History reviewed. No pertinent past medical history.  There are no active problems to display for this patient.   Past Surgical History:  Procedure Laterality Date  . ARTHROSCOPIC REPAIR ACL          Home Medications    Prior to Admission medications   Medication Sig Start Date End Date Taking? Authorizing Provider  amoxicillin (AMOXIL) 500 MG capsule Take 1 capsule (500 mg total) by mouth 2 (two) times daily. 10/23/18   Belinda Fisher, PA-C    Family History Family History  Problem Relation Age of Onset  . Stroke Mother   . Cancer Father     Social History Social History   Tobacco Use  . Smoking status: Never Smoker  . Smokeless tobacco: Never Used  Substance Use Topics  . Alcohol use: Yes  . Drug use: No     Allergies   Patient has no known allergies.   Review of Systems Review of Systems  Constitutional: Negative for fever.  Respiratory: Negative for shortness of breath.   Cardiovascular: Negative for chest pain.  Skin: Negative for color change and pallor.  All other systems reviewed and are negative.    Physical Exam Updated Vital Signs  BP (!) 164/101 (BP Location: Left Arm)   Pulse 85   Temp 98.5 F (36.9 C) (Oral)   Resp 16   SpO2 99%   Physical Exam Vitals signs and nursing note reviewed.  Constitutional:      General: He is not in acute distress.    Appearance: Normal appearance.  HENT:     Head: Normocephalic and atraumatic.     Nose: Nose normal.     Mouth/Throat:     Mouth: Mucous membranes are moist.     Pharynx: Oropharynx is clear.  Eyes:     Conjunctiva/sclera: Conjunctivae normal.     Pupils: Pupils are equal, round, and reactive to light.  Neck:     Musculoskeletal: Normal range of motion and neck supple.  Cardiovascular:     Rate and Rhythm: Normal rate and regular rhythm.     Pulses: Normal pulses.     Heart sounds: Normal heart sounds.  Pulmonary:     Effort: Pulmonary effort is normal. No respiratory distress.     Breath sounds: Normal breath sounds. No stridor. No wheezing, rhonchi or rales.  Chest:     Chest wall: No tenderness.  Abdominal:     General: Abdomen is flat. Bowel sounds are normal.     Tenderness: There is no abdominal tenderness. There is no  guarding or rebound.  Musculoskeletal:     Comments: Left palm with scratch  Skin:    General: Skin is warm and dry.     Capillary Refill: Capillary refill takes less than 2 seconds.     Comments: Superficial scratches on B dorsum of the hands  Neurological:     General: No focal deficit present.     Mental Status: He is alert and oriented to person, place, and time.  Psychiatric:        Mood and Affect: Mood normal.        Behavior: Behavior normal.      ED Treatments / Results  Labs (all labs ordered are listed, but only abnormal results are displayed) Labs Reviewed - No data to display  EKG None  Radiology No results found.  Procedures Procedures (including critical care time)  Medications Ordered in ED Medications  amoxicillin-clavulanate (AUGMENTIN) 875-125 MG per tablet 1 tablet (1 tablet Oral Given  10/26/18 0218)  Tdap (BOOSTRIX) injection 0.5 mL (0.5 mLs Intramuscular Given 10/26/18 0218)     Dog to be quarantined, wound care in the ED  Final Clinical Impressions(s) / ED Diagnoses    Return for intractable cough, coughing up blood,fevers >100.4 unrelieved by medication, shortness of breath, intractable vomiting, chest pain, shortness of breath, weakness,numbness, changes in speech, facial asymmetry,abdominal pain, passing out,Inability to tolerate liquids or food, cough, altered mental status or any concerns. No signs of systemic illness or infection. The patient is nontoxic-appearing on exam and vital signs are within normal limits.   I have reviewed the triage vital signs and the nursing notes. Pertinent labs &imaging results that were available during my care of the patient were reviewed by me and considered in my medical decision making (see chart for details).  After history, exam, and medical workup I feel the patient has been appropriately medically screened and is safe for discharge home. Pertinent diagnoses were discussed with the patient. Patient was given return precautions.   Xavi Tomasik, MD 10/26/18 3536    Nicanor Alcon, Alaura Schippers, MD 10/26/18 0230

## 2019-02-08 ENCOUNTER — Other Ambulatory Visit: Payer: Self-pay

## 2019-02-08 DIAGNOSIS — Z20822 Contact with and (suspected) exposure to covid-19: Secondary | ICD-10-CM

## 2019-02-15 LAB — NOVEL CORONAVIRUS, NAA: SARS-CoV-2, NAA: NOT DETECTED

## 2019-02-22 ENCOUNTER — Telehealth: Payer: Self-pay | Admitting: Hematology

## 2019-02-22 NOTE — Telephone Encounter (Signed)
Pt is aware covid 19 test is negative °

## 2022-08-04 ENCOUNTER — Emergency Department (HOSPITAL_COMMUNITY)
Admission: EM | Admit: 2022-08-04 | Discharge: 2022-08-04 | Disposition: A | Discharge status: Home / Self Care | Payer: Self-pay | Attending: Emergency Medicine | Admitting: Emergency Medicine

## 2022-08-04 ENCOUNTER — Emergency Department (HOSPITAL_COMMUNITY): Payer: Self-pay

## 2022-08-04 ENCOUNTER — Encounter (HOSPITAL_COMMUNITY): Payer: Self-pay | Admitting: Emergency Medicine

## 2022-08-04 DIAGNOSIS — R3121 Asymptomatic microscopic hematuria: Secondary | ICD-10-CM

## 2022-08-04 DIAGNOSIS — R5383 Other fatigue: Secondary | ICD-10-CM | POA: Insufficient documentation

## 2022-08-04 DIAGNOSIS — M545 Low back pain, unspecified: Secondary | ICD-10-CM | POA: Insufficient documentation

## 2022-08-04 DIAGNOSIS — J101 Influenza due to other identified influenza virus with other respiratory manifestations: Secondary | ICD-10-CM | POA: Insufficient documentation

## 2022-08-04 DIAGNOSIS — Z1152 Encounter for screening for COVID-19: Secondary | ICD-10-CM | POA: Insufficient documentation

## 2022-08-04 LAB — CBC WITH DIFFERENTIAL/PLATELET
Abs Immature Granulocytes: 0.02 10*3/uL (ref 0.00–0.07)
Basophils Absolute: 0 10*3/uL (ref 0.0–0.1)
Basophils Relative: 1 %
Eosinophils Absolute: 0 10*3/uL (ref 0.0–0.5)
Eosinophils Relative: 0 %
HCT: 41.4 % (ref 39.0–52.0)
Hemoglobin: 13.1 g/dL (ref 13.0–17.0)
Immature Granulocytes: 0 %
Lymphocytes Relative: 6 %
Lymphs Abs: 0.4 10*3/uL — ABNORMAL LOW (ref 0.7–4.0)
MCH: 25.4 pg — ABNORMAL LOW (ref 26.0–34.0)
MCHC: 31.6 g/dL (ref 30.0–36.0)
MCV: 80.4 fL (ref 80.0–100.0)
Monocytes Absolute: 0.7 10*3/uL (ref 0.1–1.0)
Monocytes Relative: 11 %
Neutro Abs: 5.1 10*3/uL (ref 1.7–7.7)
Neutrophils Relative %: 82 %
Platelets: 193 10*3/uL (ref 150–400)
RBC: 5.15 MIL/uL (ref 4.22–5.81)
RDW: 13.3 % (ref 11.5–15.5)
WBC: 6.2 10*3/uL (ref 4.0–10.5)
nRBC: 0 % (ref 0.0–0.2)

## 2022-08-04 LAB — COMPREHENSIVE METABOLIC PANEL
ALT: 37 U/L (ref 0–44)
AST: 43 U/L — ABNORMAL HIGH (ref 15–41)
Albumin: 4.2 g/dL (ref 3.5–5.0)
Alkaline Phosphatase: 56 U/L (ref 38–126)
Anion gap: 9 (ref 5–15)
BUN: 9 mg/dL (ref 6–20)
CO2: 26 mmol/L (ref 22–32)
Calcium: 9.2 mg/dL (ref 8.9–10.3)
Chloride: 101 mmol/L (ref 98–111)
Creatinine, Ser: 1.1 mg/dL (ref 0.61–1.24)
GFR, Estimated: >60 mL/min (ref >60)
Glucose, Bld: 127 mg/dL — ABNORMAL HIGH (ref 70–99)
Potassium: 3.6 mmol/L (ref 3.5–5.1)
Sodium: 136 mmol/L (ref 135–145)
Total Bilirubin: 0.6 mg/dL (ref 0.3–1.2)
Total Protein: 7.5 g/dL (ref 6.5–8.1)

## 2022-08-04 LAB — URINALYSIS, ROUTINE W REFLEX MICROSCOPIC
Bilirubin Urine: NEGATIVE
Glucose, UA: NEGATIVE mg/dL
Ketones, ur: 20 mg/dL — AB
Leukocytes,Ua: NEGATIVE
Nitrite: NEGATIVE
Protein, ur: NEGATIVE mg/dL
Specific Gravity, Urine: 1.012 (ref 1.005–1.030)
pH: 6 (ref 5.0–8.0)

## 2022-08-04 LAB — RESP PANEL BY RT-PCR (RSV, FLU A&B, COVID)  RVPGX2
Influenza A by PCR: POSITIVE — AB
Influenza B by PCR: NEGATIVE
Resp Syncytial Virus by PCR: NEGATIVE
SARS Coronavirus 2 by RT PCR: NEGATIVE

## 2022-08-04 LAB — LIPASE, BLOOD: Lipase: 27 U/L (ref 11–51)

## 2022-08-04 MED ORDER — KETOROLAC TROMETHAMINE 15 MG/ML IJ SOLN
15.0000 mg | Freq: Once | INTRAMUSCULAR | Status: AC
  Administered 2022-08-04: 15 mg via INTRAVENOUS
  Filled 2022-08-04: qty 1

## 2022-08-04 MED ORDER — METHOCARBAMOL 500 MG PO TABS: 500.0000 mg | ORAL_TABLET | Freq: Three times a day (TID) | ORAL | 0 refills | Status: AC | PRN

## 2022-08-04 MED ORDER — ACETAMINOPHEN 500 MG PO TABS
1000.0000 mg | ORAL_TABLET | ORAL | Status: AC
  Administered 2022-08-04: 1000 mg via ORAL
  Filled 2022-08-04: qty 2

## 2022-08-04 MED ORDER — ONDANSETRON 4 MG PO TBDP: 4.0000 mg | ORAL_TABLET | Freq: Three times a day (TID) | ORAL | 0 refills | Status: DC | PRN

## 2022-08-04 MED ORDER — SODIUM CHLORIDE 0.9 % IV BOLUS
1000.0000 mL | Freq: Once | INTRAVENOUS | Status: AC
  Administered 2022-08-04: 1000 mL via INTRAVENOUS

## 2022-08-04 MED ORDER — ONDANSETRON HCL 4 MG PO TABS: 4.0000 mg | ORAL_TABLET | Freq: Three times a day (TID) | ORAL | 0 refills | Status: AC | PRN

## 2022-08-04 NOTE — Discharge Instructions (Addendum)
Please follow-up with your primary care doctor if you do not have a primary care doctor please follow-up with [facility] I have given you the information for this office.  If you do have insurance you can call your insurance provider to see if they have any network providers that they recommend.  Tylenol 1000 mg every 6 hours as needed for back pain,  you can alternate with Motrin as discussed below.  Please use Tylenol or ibuprofen for pain.  You may use 600 mg ibuprofen every 6 hours or 1000 mg of Tylenol every 6 hours.  You may choose to alternate between the 2.  This would be most effective.  Not to exceed 4 g of Tylenol within 24 hours.  Not to exceed 3200 mg ibuprofen 24 hours.    Use Zofran as needed for any nausea you experience. I  have also given you a muscle relaxer to help with your back pain.  You are positive for the flu and should not return to work until fever free for at least 24 hours.   If you develop chest pain, shortness of breath, inability to eat or drink, or other concerns, please return to closest emergency department for re-evaluation.

## 2022-08-04 NOTE — ED Provider Notes (Signed)
[facility] EMERGENCY DEPARTMENT Provider Note   CSN: [id number] Arrival date & time: 08/04/22  1206     History  Chief Complaint  Patient presents with   Flank Pain    Allen Leon is a 41 y.o. male.   Flank Pain  Patient is a 41 year old male with no pertinent past medical history present emergency room today with 2 weeks of fatigue decreased p.o. intake, generalized weakness some low back pain and achiness does endorse some occasional cough.  No chest pain or difficulty breathing, does endorse some occasional episodes of lightheadedness but no vertigo and no presyncope.  No syncope.      Home Medications Prior to Admission medications   Medication Sig Start Date End Date Taking? Authorizing Provider  ondansetron (ZOFRAN-ODT) 4 MG disintegrating tablet Take 1 tablet (4 mg total) by mouth every 8 (eight) hours as needed for nausea or vomiting. 08/04/22  Yes Claudetta Sallie S, PA  amoxicillin (AMOXIL) 500 MG capsule Take 1 capsule (500 mg total) by mouth 2 (two) times daily. 10/23/18   Cathie Hoops, Amy V, PA-C  amoxicillin-clavulanate (AUGMENTIN) 875-125 MG tablet Take 1 tablet by mouth 2 (two) times daily. One po bid x 7 days 10/26/18   Nicanor Alcon, April, MD      Allergies    Patient has no known allergies.    Review of Systems   Review of Systems  Genitourinary:  Positive for flank pain.    Physical Exam Updated Vital Signs BP (!) 147/89 (BP Location: Right Arm)   Pulse 99   Temp 99.4 F (37.4 C) (Oral)   Resp 19   Ht 5\' 6"  (1.676 m)   Wt 70.3 kg   SpO2 99%   BMI 25.02 kg/m  Physical Exam Vitals and nursing note reviewed.  Constitutional:      General: He is not in acute distress. HENT:     Head: Normocephalic and atraumatic.     Nose: Nose normal.     Mouth/Throat:     Mouth: Mucous membranes are dry.  Eyes:     General: No scleral icterus. Cardiovascular:     Rate and Rhythm: Normal rate and regular rhythm.     Pulses: Normal pulses.      Heart sounds: Normal heart sounds.  Pulmonary:     Effort: Pulmonary effort is normal. No respiratory distress.     Breath sounds: No wheezing.  Abdominal:     Palpations: Abdomen is soft.     Tenderness: There is no abdominal tenderness.  Musculoskeletal:     Cervical back: Normal range of motion.     Right lower leg: No edema.     Left lower leg: No edema.     Comments: No midline C, T, L-spine tenderness, some diffuse paravertebral lumbar musculature tenderness.  Skin:    General: Skin is warm and dry.     Capillary Refill: Capillary refill takes less than 2 seconds.  Neurological:     Mental Status: He is alert. Mental status is at baseline.  Psychiatric:        Mood and Affect: Mood normal.        Behavior: Behavior normal.     ED Results / Procedures / Treatments   Labs (all labs ordered are listed, but only abnormal results are displayed) Labs Reviewed  CBC WITH DIFFERENTIAL/PLATELET - Abnormal; Notable for the following components:      Result Value   MCH 25.4 (*)    Lymphs Abs 0.4 (*)  All other components within normal limits  COMPREHENSIVE METABOLIC PANEL - Abnormal; Notable for the following components:   Glucose, Bld 127 (*)    AST 43 (*)    All other components within normal limits  RESP PANEL BY RT-PCR (RSV, FLU A&B, COVID)  RVPGX2  LIPASE, BLOOD  URINALYSIS, ROUTINE W REFLEX MICROSCOPIC    EKG None  Radiology DG Chest 2 View  Result Date: 08/04/2022 CLINICAL DATA:  Cough and fatigue EXAM: CHEST - 2 VIEW COMPARISON:  None Available. FINDINGS: The heart size and mediastinal contours are within normal limits. Both lungs are clear. The visualized skeletal structures are unremarkable. IMPRESSION: No active cardiopulmonary disease. Electronically Signed   By: Alcide Clever M.D.   On: 08/04/2022 15:20   CT ABDOMEN PELVIS WO CONTRAST  Result Date: 08/04/2022 CLINICAL DATA:  Acute bilateral flank pain. EXAM: CT ABDOMEN AND PELVIS WITHOUT CONTRAST  TECHNIQUE: Multidetector CT imaging of the abdomen and pelvis was performed following the standard protocol without IV contrast. RADIATION DOSE REDUCTION: This exam was performed according to the departmental dose-optimization program which includes automated exposure control, adjustment of the mA and/or kV according to patient size and/or use of iterative reconstruction technique. COMPARISON:  None Available. FINDINGS: Lower chest: No acute abnormality. Hepatobiliary: No focal liver abnormality is seen. No gallstones, gallbladder wall thickening, or biliary dilatation. Pancreas: Unremarkable. No pancreatic ductal dilatation or surrounding inflammatory changes. Spleen: Normal in size without focal abnormality. Adrenals/Urinary Tract: Adrenal glands appear normal. Nonobstructive right renal calculus is noted. No hydronephrosis or renal obstruction is noted. Urinary bladder is unremarkable. Stomach/Bowel: Stomach is within normal limits. Appendix appears normal. No evidence of bowel wall thickening, distention, or inflammatory changes. Vascular/Lymphatic: No significant vascular findings are present. No enlarged abdominal or pelvic lymph nodes. Reproductive: Prostate is unremarkable. Other: No abdominal wall hernia or abnormality. No abdominopelvic ascites. Musculoskeletal: No acute or significant osseous findings. IMPRESSION: Nonobstructive right renal calculus. No hydronephrosis or renal obstruction is noted. No other abnormality seen in the abdomen or pelvis. Electronically Signed   By: Lupita Raider M.D.   On: 08/04/2022 14:09    Procedures Procedures    Medications Ordered in ED Medications  sodium chloride 0.9 % bolus 1,000 mL (1,000 mLs Intravenous New Bag/Given 08/04/22 1456)  acetaminophen (TYLENOL) tablet 1,000 mg (1,000 mg Oral Given 08/04/22 1455)  ketorolac (TORADOL) 15 MG/ML injection 15 mg (15 mg Intravenous Given 08/04/22 1455)    ED Course/ Medical Decision Making/ A&P                            Medical Decision Making Amount and/or Complexity of Data Reviewed Radiology: ordered.  Risk OTC drugs. Prescription drug management.   This patient presents to the ED for concern of fatigue and back pain, this involves a number of treatment options, and is a complaint that carries with it a moderate wellness risk of complications and morbidity. A differential diagnosis was considered for the patient's symptoms which is discussed below:   The differential diagnosis of weakness includes but is not limited to neurologic causes (GBS, myasthenia gravis, CVA, MS, ALS, transverse myelitis, spinal cord injury, CVA, botulism, ) and other causes: ACS, Arrhythmia, syncope, orthostatic hypotension, sepsis, hypoglycemia, electrolyte disturbance, hypothyroidism, respiratory failure, symptomatic anemia, dehydration, heat injury, polypharmacy, malignancy.    Co morbidities: Discussed in HPI   Brief History:  Patient is a 41 year old male with no pertinent past medical history present emergency room today with  2 weeks of fatigue decreased p.o. intake, generalized weakness some low back pain and achiness does endorse some occasional cough.  No chest pain or difficulty breathing, does endorse some occasional episodes of lightheadedness but no vertigo and no presyncope.  No syncope.    EMR reviewed including pt PMHx, past surgical history and past visits to ER.   See HPI for more details   Lab Tests:   I personally reviewed all laboratory work and imaging. Metabolic panel without any acute abnormality specifically kidney function within normal limits and no significant electrolyte abnormalities. CBC without leukocytosis or significant anemia.   Imaging Studies:  NAD. I personally reviewed all imaging studies and no acute abnormality found. I agree with radiology interpretation. Chest x-ray and CT abdomen pelvis without contrast without any abnormal findings.   Cardiac  Monitoring:   PENDING   Medicines ordered:  I ordered medication including Tylenol, Toradol, 1 L normal saline for hydration Reevaluation of the patient after these medicines showed that the patient improved I have reviewed the patients home medicines and have made adjustments as needed   Critical Interventions:     Consults/Attending Physician      Reevaluation:  After the interventions noted above I re-evaluated patient and found that they have :improved   Social Determinants of Health:      Problem List / ED Course:  Low back pain seems to be more likely muscular in origin.  Conservative therapy at this time return precautions Fatigue.  Added on chest x-ray and EKG and urinalysis.  Urinalysis and EKG pending.  Chest x-ray unremarkable.  Oncoming team will review you the results of this and reevaluate patient anticipate discharge home unless a new or emergent finding is discovered.   Dispostion:  3:30 PM Care of @PATIENTNAME @ transferred to PA MG and Dr. Durwin Nora at the end of my shift as the patient will require reassessment once labs/imaging have resulted. Patient presentation, ED course, and plan of care discussed with review of all pertinent labs and imaging. Please see his/her note for further details regarding further ED course and disposition. Plan at time of handoff is discharge after urine and EKG. This may be altered or completely changed at the discretion of the oncoming team pending results of further workup.    Final Clinical Impression(s) / ED Diagnoses Final diagnoses:  Other fatigue  Acute low back pain without sciatica, unspecified back pain laterality    Rx / DC Orders ED Discharge Orders          Ordered    ondansetron (ZOFRAN-ODT) 4 MG disintegrating tablet  Every 8 hours PRN        08/04/22 1527              Gailen Shelter, [place] 08/04/22 1530    Benjiman Core, MD 08/05/22 [phone]

## 2022-08-04 NOTE — ED Provider Triage Note (Signed)
Emergency Medicine Provider Triage Evaluation Note  Davelle D Torrence , a 41 y.o. male  was evaluated in triage.  Pt complains of bilateral flank pain which began last night.  Patient describes some difficulty with urination coinciding with the flank pain.  He denies frank dysuria, hematuria.  Denies nausea or vomiting.  Review of Systems  Positive: As above Negative: As above  Physical Exam  BP (!) 147/89 (BP Location: Right Arm)   Pulse 99   Temp 99.4 F (37.4 C) (Oral)   Resp 19   Ht 5\' 6"  (1.676 m)   Wt 70.3 kg   SpO2 99%   BMI 25.02 kg/m  Gen:   Awake, no distress   Resp:  Normal effort  MSK:   Moves extremities without difficulty  Other:    Medical Decision Making  Medically screening exam initiated at 12:25 PM.  Appropriate orders placed.  Jaskaran D Harrington was informed that the remainder of the evaluation will be completed by another provider, this initial triage assessment does not replace that evaluation, and the importance of remaining in the ED until their evaluation is complete.     , PA-C 08/04/22 1226

## 2022-08-04 NOTE — ED Triage Notes (Signed)
Pt reports bilateral flank pain and not eating in 2 days. Reports lack of appetite and dehydration. Denies pain with urination.

## 2022-08-04 NOTE — ED Provider Notes (Addendum)
Pt handoff from day shift PA, [facility], pending UA and EKG. Pt presenting with lower back pain, body aches, fatigue, and generalized weakness. Anticipated discharge if UA and EKG look ok. EKG normal sinus rhythm with no acute ST-T changes. Pt rechecked and updated on results including positive influenza swab. Discussed risks and benefits of anti-viral medications, pt declines at this time. He does have moderate paraspinal muscle tenderness bilaterally on exam. 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 No abdominal tenderness, rebound, guarding, or CVA tenderness. No midline CTL spine tenderness or step-offs. He is not having numbness, tingling, focal weakness, bowel or bladder dysfunction, or other red flag symptoms for back pain and has had no injury. Will give short term supply of muscle relaxers and anto-emetics for symptomatic control of his back pain and nausea I believe are attributed to acute influenza. Discussed isolation precautions for influenza and that he should not return to work until fever free for 24 hours. No dysuria, gross hematuria, or decreased urination and UA though with hematuria has no signs of infection so encouraged PCP follow-up for recheck of his urine and possible further evaluation of asymptomatic hematuria. Will give clinic information for follow-up. Pt understood and agreed with this plan. Given strict return precautions for return including chest pain, shortness of breath, inability to tolerate PO intake, or other concerns.    Tonette Lederer, PA-C 08/04/22 1900    Richardson Dopp 08/04/22 Annita Brod, MD 08/05/22 2312

## 2023-07-29 ENCOUNTER — Other Ambulatory Visit: Payer: Self-pay

## 2023-07-29 ENCOUNTER — Encounter (HOSPITAL_COMMUNITY): Payer: Self-pay

## 2023-07-29 ENCOUNTER — Emergency Department (HOSPITAL_COMMUNITY)
Admission: EM | Admit: 2023-07-29 | Discharge: 2023-07-29 | Disposition: A | Discharge status: Home / Self Care | Payer: Self-pay | Attending: Emergency Medicine | Admitting: Emergency Medicine

## 2023-07-29 ENCOUNTER — Emergency Department (HOSPITAL_COMMUNITY): Payer: Self-pay

## 2023-07-29 DIAGNOSIS — X501XXA Overexertion from prolonged static or awkward postures, initial encounter: Secondary | ICD-10-CM | POA: Insufficient documentation

## 2023-07-29 DIAGNOSIS — S6991XA Unspecified injury of right wrist, hand and finger(s), initial encounter: Secondary | ICD-10-CM | POA: Insufficient documentation

## 2023-07-29 MED ORDER — ACETAMINOPHEN 500 MG PO TABS
1000.0000 mg | ORAL_TABLET | Freq: Once | ORAL | Status: AC
  Administered 2023-07-29: 1000 mg via ORAL
  Filled 2023-07-29: qty 2

## 2023-07-29 NOTE — ED Triage Notes (Signed)
Pt arrived POV from home c/o a right pointer finger injury. Pt states about an hour ago he bent it back and heard a pop.

## 2023-07-29 NOTE — ED Provider Triage Note (Signed)
Emergency Medicine Provider Triage Evaluation Note  Allen Leon , a 42 y.o. male  was evaluated in triage.  Pt complains of Finger pain.  Patient extended his finger and felt a snap and feels like his foot unable to fully extend his right ring finger.  He is right-hand dominant..  Review of Systems  Positive: R finger pain  Negative:  bruising  Physical Exam  BP (!) 156/94   Pulse 88   Temp 98.9 F (37.2 C) (Oral)   Resp 16   Ht 5\' 6"  (1.676 m)   Wt 72.6 kg   SpO2 100%   BMI 25.82 kg/m  Gen:   Awake, no distress   Resp:  Normal effort  MSK:   Moves extremities without difficulty  Other:    Medical Decision Making  Medically screening exam initiated at 1:13 PM.  Appropriate orders placed.  Allen Leon was informed that the remainder of the evaluation will be completed by another provider, this initial triage assessment does not replace that evaluation, and the importance of remaining in the ED until their evaluation is complete.     Arthor Captain, PA-C 07/29/23 1314

## 2023-07-29 NOTE — Progress Notes (Signed)
Orthopedic Tech Progress Note Patient Details:  OTTO SPAGNOLI 1981-05-12 [id number]  Ulnar gutter splint applied to RUE  Ortho Devices Type of Ortho Device: Ulna gutter splint, Ace wrap, Cotton web roll Ortho Device/Splint Location: RUE Ortho Device/Splint Interventions: Ordered, Application   Post Interventions Patient Tolerated: Well  Diannia Ruder 07/29/2023, 9:37 PM

## 2023-07-29 NOTE — Discharge Instructions (Addendum)
You have been seen here in the emergency department for right hand injury. We have obtained a full history, performed a physical exam, in addition to other diagnostic tests and treatments. Right now, we feel that you are safe for discharge from a medical perspective, and do not have an acute life threatening illness.   An fracture of the fourth metacarpal shaft with ulnar displacement  of distal fracture fragment. No intra-articular extension.    To do: 1.) Take all medications as prescribed.   2.) If anything changes, or you develop fevers, chills, inability to eat or drink, severe pain, new symptoms, return of symptoms, worsening of symptoms, or any other concerns, please call 911 or come back to the emergency department as soon as possible.   3.) Please make an appointment with your primary care doctor for a follow-up visit after being seen here in the emergency department. If you do not have a primary care doctor, you can call [phone] for assistance in finding one or your health care insurance company.    Thank you for allowing me to take care of you today. We hope that you feel better soon.

## 2023-07-29 NOTE — ED Provider Notes (Signed)
[place] [facility] AT [facility] Provider Note  HPI   Allen Leon is a 42 y.o. male patient with a PMHx of none who is here today with concern for hand injury.  Patient is right-handed, states that his wife went to grab his keys however excellently grabbed his finger and pulled it back, he had a pop sensation and felt it over his fourth metacarpal, he has had pain there since    ROS Negative except as per HPI   Medical Decision Making   Upon presentation, the patient is afebrile, HDS. Patient has pain over the fourth metacarpal, 2+ radial pulse, able to make the thumb up sign, okay sign, able to feel me touching in all 5 digits   Patient does have a fourth metacarpal fracture in the midshaft.  He has no neurovascular compromise, he is able to move the digit accordingly.  Will apply a ulnar gutter splint, and then have orthopedics follow-up.  Will give this patient with Tylenol, and will recommend Tylenol and ibuprofen as a pain regimen at home  At this time, I feel that the patient is medically cleared for discharge and have discussed this with my attending who agrees.  I discussed with the patient and/or family my overall assessment, including my physical exam, labs, imaging, other diagnostic tests, and therapeutics given.  All questions answered and understanding is expressed.  I have instructed to call PCP to establish an outpatient appointment after this ED visit, and necessary specialty follow up if needed. I gave strict return precautions to come back to the ED including fevers, chills, severe pain, worsening of symptoms, return of symptoms, new and concerning symptoms, inability to tolerate p.o. intake, among others. I specifically stated to return if symptoms worsen or return  1. Injury of finger of right hand, initial encounter     @DISPOSITION @  Rx / DC Orders ED Discharge Orders     None        History reviewed. No pertinent past medical  history. Past Surgical History:  Procedure Laterality Date   ARTHROSCOPIC REPAIR ACL     Family History  Problem Relation Age of Onset   Stroke Mother    Cancer Father    Social History   Socioeconomic History   Marital status: Single    Spouse name: Not on file   Number of children: Not on file   Years of education: Not on file   Highest education level: Not on file  Occupational History   Not on file  Tobacco Use   Smoking status: Never   Smokeless tobacco: Never  Substance and Sexual Activity   Alcohol use: Yes   Drug use: No   Sexual activity: Not on file  Other Topics Concern   Not on file  Social History Narrative   Not on file   Social Drivers of Health   Financial Resource Strain: Not on file  Food Insecurity: Not on file  Transportation Needs: Not on file  Physical Activity: Not on file  Stress: Not on file  Social Connections: Not on file  Intimate Partner Violence: Not on file     Physical Exam   Vitals:   07/29/23 1250 07/29/23 1312 07/29/23 1945  BP: (!) 156/94  (!) 164/102  Pulse: 88  74  Resp: 16  17  Temp: 98.9 F (37.2 C)  98.7 F (37.1 C)  TempSrc: Oral  Oral  SpO2: 100%  96%  Weight:  72.6 kg  Height:  5\' 6"  (1.676 m)     Physical Exam Vitals and nursing note reviewed.  Constitutional:      General: He is not in acute distress.    Appearance: Normal appearance. He is well-developed. He is not ill-appearing or toxic-appearing.  HENT:     Head: Normocephalic and atraumatic.     Right Ear: External ear normal.     Left Ear: External ear normal.     Nose: Nose normal.     Mouth/Throat:     Mouth: Mucous membranes are moist.  Eyes:     Extraocular Movements: Extraocular movements intact.     Pupils: Pupils are equal, round, and reactive to light.  Cardiovascular:     Rate and Rhythm: Normal rate.     Pulses: Normal pulses.  Pulmonary:     Effort: No respiratory distress.     Breath sounds: Normal breath sounds. No stridor.   Abdominal:     Palpations: Abdomen is soft.  Musculoskeletal:        General: Normal range of motion.     Cervical back: Normal range of motion and neck supple.     Comments: Patient has pain over the fourth metacarpal, 2+ radial pulse, able to make the thumb up sign, okay sign, able to feel me touching in all 5 digits  Skin:    General: Skin is warm and dry.     Capillary Refill: Capillary refill takes less than 2 seconds.  Neurological:     General: No focal deficit present.     Mental Status: He is alert and oriented to person, place, and time. Mental status is at baseline.  Psychiatric:        Mood and Affect: Mood normal.     Procedures   If procedures were preformed on this patient, they are listed below:  Procedures  The patient was seen, evaluated, and treated in conjunction with the attending physician, who voiced agreement in the care provided.  Note generated using Dragon voice dictation software and may contain dictation errors. Please contact me for any clarification or with any questions.   Electronically signed by:  Osvaldo Shipper, M.D. (PGY-2)    Gunnar Bulla, MD 07/29/23 2122    Melene Plan, DO 07/29/23 2125
